# Patient Record
Sex: Female | Born: 1960 | Race: White | Hispanic: No | Marital: Married | Smoking: Never smoker
Health system: Southern US, Community
[De-identification: ages and names within clinical notes are randomized; demographics above are authoritative.]

## PROBLEM LIST (undated history)

## (undated) DIAGNOSIS — K219 Gastro-esophageal reflux disease without esophagitis: Secondary | ICD-10-CM

## (undated) DIAGNOSIS — R079 Chest pain, unspecified: Secondary | ICD-10-CM

## (undated) DIAGNOSIS — R002 Palpitations: Secondary | ICD-10-CM

## (undated) HISTORY — PX: OTHER SURGICAL HISTORY: SHX169

## (undated) HISTORY — PX: TONSILLECTOMY: SUR1361

## (undated) HISTORY — DX: Palpitations: R00.2

## (undated) HISTORY — DX: Chest pain, unspecified: R07.9

---

## 2011-12-15 ENCOUNTER — Other Ambulatory Visit (HOSPITAL_COMMUNITY): Payer: Self-pay | Admitting: Obstetrics & Gynecology

## 2011-12-15 DIAGNOSIS — Z1231 Encounter for screening mammogram for malignant neoplasm of breast: Secondary | ICD-10-CM

## 2012-02-26 ENCOUNTER — Ambulatory Visit (HOSPITAL_COMMUNITY)
Admission: RE | Admit: 2012-02-26 | Discharge: 2012-02-26 | Disposition: A | Discharge status: Home / Self Care | Payer: Managed Care, Other (non HMO) | Source: Ambulatory Visit | Attending: Obstetrics & Gynecology | Admitting: Obstetrics & Gynecology

## 2012-02-26 DIAGNOSIS — Z1231 Encounter for screening mammogram for malignant neoplasm of breast: Secondary | ICD-10-CM

## 2013-01-30 ENCOUNTER — Other Ambulatory Visit (HOSPITAL_COMMUNITY): Payer: Self-pay | Admitting: Obstetrics & Gynecology

## 2013-01-30 DIAGNOSIS — Z1231 Encounter for screening mammogram for malignant neoplasm of breast: Secondary | ICD-10-CM

## 2013-02-26 ENCOUNTER — Ambulatory Visit (HOSPITAL_COMMUNITY)
Admission: RE | Admit: 2013-02-26 | Discharge: 2013-02-26 | Disposition: A | Discharge status: Home / Self Care | Payer: Managed Care, Other (non HMO) | Source: Ambulatory Visit | Attending: Obstetrics & Gynecology | Admitting: Obstetrics & Gynecology

## 2013-02-26 DIAGNOSIS — Z1231 Encounter for screening mammogram for malignant neoplasm of breast: Secondary | ICD-10-CM | POA: Insufficient documentation

## 2014-01-28 ENCOUNTER — Other Ambulatory Visit (HOSPITAL_COMMUNITY): Payer: Self-pay | Admitting: Obstetrics & Gynecology

## 2014-01-28 DIAGNOSIS — Z1231 Encounter for screening mammogram for malignant neoplasm of breast: Secondary | ICD-10-CM

## 2014-02-27 ENCOUNTER — Ambulatory Visit (HOSPITAL_COMMUNITY)
Admission: RE | Admit: 2014-02-27 | Discharge: 2014-02-27 | Disposition: A | Discharge status: Home / Self Care | Payer: Managed Care, Other (non HMO) | Source: Ambulatory Visit | Attending: Obstetrics & Gynecology | Admitting: Obstetrics & Gynecology

## 2014-02-27 DIAGNOSIS — Z1231 Encounter for screening mammogram for malignant neoplasm of breast: Secondary | ICD-10-CM | POA: Insufficient documentation

## 2014-07-22 ENCOUNTER — Other Ambulatory Visit: Payer: Self-pay | Admitting: *Deleted

## 2014-07-22 DIAGNOSIS — I493 Ventricular premature depolarization: Secondary | ICD-10-CM

## 2014-07-24 ENCOUNTER — Encounter (INDEPENDENT_AMBULATORY_CARE_PROVIDER_SITE_OTHER): Payer: Managed Care, Other (non HMO)

## 2014-07-24 ENCOUNTER — Encounter: Payer: Self-pay | Admitting: Radiology

## 2014-07-24 DIAGNOSIS — I493 Ventricular premature depolarization: Secondary | ICD-10-CM

## 2014-07-24 DIAGNOSIS — I4949 Other premature depolarization: Secondary | ICD-10-CM

## 2014-07-24 NOTE — Progress Notes (Signed)
Patient ID: Mercedes Jarvis, female   DOB: 1961-06-29, 53 y.o.   MRN: [id number] E cardio 24hr holter applied

## 2014-07-28 ENCOUNTER — Emergency Department (HOSPITAL_COMMUNITY): Payer: Managed Care, Other (non HMO)

## 2014-07-28 ENCOUNTER — Emergency Department (HOSPITAL_COMMUNITY)
Admission: EM | Admit: 2014-07-28 | Discharge: 2014-07-28 | Disposition: A | Discharge status: Home / Self Care | Payer: Managed Care, Other (non HMO) | Attending: Emergency Medicine | Admitting: Emergency Medicine

## 2014-07-28 ENCOUNTER — Encounter (HOSPITAL_COMMUNITY): Payer: Self-pay | Admitting: Emergency Medicine

## 2014-07-28 DIAGNOSIS — R5381 Other malaise: Secondary | ICD-10-CM | POA: Diagnosis not present

## 2014-07-28 DIAGNOSIS — R42 Dizziness and giddiness: Secondary | ICD-10-CM | POA: Insufficient documentation

## 2014-07-28 DIAGNOSIS — Z79899 Other long term (current) drug therapy: Secondary | ICD-10-CM | POA: Insufficient documentation

## 2014-07-28 DIAGNOSIS — K219 Gastro-esophageal reflux disease without esophagitis: Secondary | ICD-10-CM | POA: Insufficient documentation

## 2014-07-28 DIAGNOSIS — R5383 Other fatigue: Secondary | ICD-10-CM

## 2014-07-28 DIAGNOSIS — R209 Unspecified disturbances of skin sensation: Secondary | ICD-10-CM | POA: Insufficient documentation

## 2014-07-28 DIAGNOSIS — R002 Palpitations: Secondary | ICD-10-CM | POA: Insufficient documentation

## 2014-07-28 DIAGNOSIS — M25519 Pain in unspecified shoulder: Secondary | ICD-10-CM | POA: Insufficient documentation

## 2014-07-28 HISTORY — DX: Gastro-esophageal reflux disease without esophagitis: K21.9

## 2014-07-28 LAB — CBC WITH DIFFERENTIAL/PLATELET
BASOS ABS: 0 10*3/uL (ref 0.0–0.1)
Basophils Relative: 0 % (ref 0–1)
EOS ABS: 0.1 10*3/uL (ref 0.0–0.7)
Eosinophils Relative: 2 % (ref 0–5)
HCT: 36.5 % (ref 36.0–46.0)
Hemoglobin: 12 g/dL (ref 12.0–15.0)
LYMPHS PCT: 51 % — AB (ref 12–46)
Lymphs Abs: 2 10*3/uL (ref 0.7–4.0)
MCH: 29 pg (ref 26.0–34.0)
MCHC: 32.9 g/dL (ref 30.0–36.0)
MCV: 88.2 fL (ref 78.0–100.0)
Monocytes Absolute: 0.3 10*3/uL (ref 0.1–1.0)
Monocytes Relative: 7 % (ref 3–12)
Neutro Abs: 1.5 10*3/uL — ABNORMAL LOW (ref 1.7–7.7)
Neutrophils Relative %: 40 % — ABNORMAL LOW (ref 43–77)
Platelets: 241 10*3/uL (ref 150–400)
RBC: 4.14 MIL/uL (ref 3.87–5.11)
RDW: 13.7 % (ref 11.5–15.5)
WBC: 3.8 10*3/uL — AB (ref 4.0–10.5)

## 2014-07-28 LAB — BASIC METABOLIC PANEL
Anion gap: 12 (ref 5–15)
BUN: 11 mg/dL (ref 6–23)
CALCIUM: 9.5 mg/dL (ref 8.4–10.5)
CO2: 27 meq/L (ref 19–32)
Chloride: 101 mEq/L (ref 96–112)
Creatinine, Ser: 0.75 mg/dL (ref 0.50–1.10)
Glucose, Bld: 99 mg/dL (ref 70–99)
Potassium: 3.8 mEq/L (ref 3.7–5.3)
Sodium: 140 mEq/L (ref 137–147)

## 2014-07-28 LAB — I-STAT TROPONIN, ED: TROPONIN I, POC: 0 ng/mL (ref 0.00–0.08)

## 2014-07-28 MED ORDER — SODIUM CHLORIDE 0.9 % IV BOLUS (SEPSIS)
1000.0000 mL | Freq: Once | INTRAVENOUS | Status: AC
  Administered 2014-07-28: 1000 mL via INTRAVENOUS

## 2014-07-28 NOTE — ED Provider Notes (Signed)
CSN: [id number]     Arrival date & time 07/28/14  0809 History   First MD Initiated Contact with Patient 07/28/14 0815     Chief Complaint  Patient presents with  . Shoulder Pain  . Numbness  . Palpitations   (Consider location/radiation/quality/duration/timing/severity/associated sxs/prior Treatment) Patient is a 53 y.o. female presenting with shoulder pain and palpitations. The history is provided by the patient.  Shoulder Pain This is a new problem. The current episode started yesterday. The problem occurs daily. The problem has been gradually worsening. Associated symptoms include arthralgias (left shoulder pain), numbness (left arm) and weakness (generalized). Pertinent negatives include no abdominal pain, chest pain, coughing, fever, headaches, nausea, neck pain, rash, sore throat, vertigo or vomiting.  Palpitations Palpitations quality:  Fast Onset quality:  Sudden Duration: >1 year. Timing:  Sporadic Progression:  Worsening Chronicity:  New Relieved by:  Nothing Exacerbated by: exertion. Ineffective treatments:  None tried Associated symptoms: numbness (left arm) and weakness (generalized)   Associated symptoms: no back pain, no chest pain, no cough, no dizziness, no nausea, no near-syncope, no shortness of breath, no syncope and no vomiting    The patient is a 53 year old Caucasian female with a history of GERD who presents with approximately one year palpitations. The patient states that she saw her PCP approximately one week ago for the symptoms. An EKG was done at that time and showed PVCs. The patient was placed on a 24-hour Holter monitor. During the 24 hour time frame the patient experienced multiple episodes of long-lasting palpitations as well as episodes of lightheadedness. The patient states that she went on her daily five-mile walk yesterday and experienced extreme weakness and fatigue afterwards. The patient wanted to come to the ED last night but hesitated and decided  to come this morning. The patient also reports a left arm and leg numbness which is worse in the upper extremity. She denies chest pain,  shortness of breath,  room spinning sensation, vaginal bleeding, rectal bleeding, hematuria, or history of anemia. The patient does not have any known cardiac disease. The patient's GERD is controlled with Protonix.  Past Medical History  Diagnosis Date  . GERD (gastroesophageal reflux disease)    History reviewed. No pertinent past surgical history. History reviewed. No pertinent family history. History  Substance Use Topics  . Smoking status: Never Smoker   . Smokeless tobacco: Not on file  . Alcohol Use: Yes   OB History   Grav Para Term Preterm Abortions TAB SAB Ect Mult Living                 Review of Systems  Constitutional: Negative for fever.  HENT: Negative for rhinorrhea and sore throat.   Eyes: Negative for visual disturbance.  Respiratory: Negative for cough, chest tightness and shortness of breath.   Cardiovascular: Positive for palpitations. Negative for chest pain, syncope and near-syncope.  Gastrointestinal: Negative for nausea, vomiting, abdominal pain, diarrhea, constipation, blood in stool and anal bleeding.  Genitourinary: Negative for dysuria, hematuria, vaginal bleeding and vaginal discharge.  Musculoskeletal: Positive for arthralgias (left shoulder pain). Negative for back pain and neck pain.  Skin: Negative for rash.  Neurological: Positive for weakness (generalized), light-headedness and numbness (left arm). Negative for dizziness, vertigo, syncope and headaches.  Psychiatric/Behavioral: Negative for confusion.  All other systems reviewed and are negative.  Allergies  Sulfa antibiotics  Home Medications   Prior to Admission medications   Medication Sig Start Date End Date Taking? Authorizing Provider  ibuprofen (ADVIL,MOTRIN)  200 MG tablet Take 600 mg by mouth daily as needed for headache.   Yes Historical Provider,  MD  MAGNESIUM PO Take 1 tablet by mouth daily as needed (Menstrual symptoms).   Yes Historical Provider, MD  Multiple Vitamins-Minerals (MULTIVITAMIN PO) Take by mouth.   Yes Historical Provider, MD  Omega-3 Fatty Acids (FISH OIL PO) Take 1 capsule by mouth daily.   Yes Historical Provider, MD  pantoprazole (PROTONIX) 40 MG tablet Take 40 mg by mouth daily. 05/16/14  Yes Historical Provider, MD   BP 141/76  Temp(Src) 98 F (36.7 C) (Oral)  Resp 12  SpO2 99%  LMP 07/19/2014 Physical Exam  Constitutional: She is oriented to person, place, and time. She appears well-developed and well-nourished. No distress.  HENT:  Head: Normocephalic and atraumatic.  Mouth/Throat: Oropharynx is clear and moist.  Eyes: EOM are normal. Pupils are equal, round, and reactive to light.  Neck: Neck supple. No JVD present. No thyromegaly present.  Cardiovascular: Normal rate, regular rhythm, normal heart sounds and intact distal pulses.  Exam reveals no gallop.   No murmur heard. Pulmonary/Chest: Effort normal and breath sounds normal. She has no wheezes. She has no rales.  Abdominal: Soft. She exhibits no distension. There is no tenderness.  Musculoskeletal: Normal range of motion. She exhibits no tenderness.  Neurological: She is alert and oriented to person, place, and time. She exhibits normal muscle tone.  Skin: Skin is warm and dry. No rash noted.   ED Course  Procedures  None   Labs Review Labs Reviewed  CBC WITH DIFFERENTIAL - Abnormal; Notable for the following:    WBC 3.8 (*)    Neutrophils Relative % 40 (*)    Neutro Abs 1.5 (*)    Lymphocytes Relative 51 (*)    All other components within normal limits  BASIC METABOLIC PANEL  I-STAT TROPOININ, ED    Imaging Review Dg Chest 2 View  07/28/2014   CLINICAL DATA:  Left chest pain extending down the left arm. Palpitations.  EXAM: CHEST  2 VIEW  COMPARISON:  None.  FINDINGS: Old granulomatous disease in the left chest with calcified hilar and  AP window lymph nodes and a 6 mm calcified granuloma in the left mid lung.  Minimal biapical pleural parenchymal scarring. Cardiac and mediastinal margins appear normal. No pleural effusion. Lungs appear otherwise clear. Mild thoracic spondylosis.  IMPRESSION: 1. No significant abnormality observed.   Electronically Signed   By: Herbie BaltimoreWalt  Liebkemann M.D.   On: 07/28/2014 09:10   MDM   Final diagnoses:  Palpitations   53 year old Caucasian female with history of GERD presents with palpitations and occasional lightheadedness. The patient is afebrile and occasionally tachycardic. Her EKG demonstrates normal sinus rhythm with a rate of 83 and no premature beats. There is no ST elevation or depression, QTC 408, no T-wave inversions. Do not suspect ACS. However given her left upper extremity symptoms will obtain troponin, CBC, BMP. We'll also provide 1 L normal saline bolus. I reviewed the labs and imaging and used them in my medical decision making. No anemia. No orthostasis. Trop negative. CXR unremarkable. Called to obtained Holter monitor report but unavailable at this time. Patient given NS bolus. Patient given F/U instructions to see cardiology. Strict return precautions given. Suspect patient symptoms due to paroxysmal PVCs. She voices understanding and is agreeable with this plan.  Stable at discharge.   Case discussed with Dr. Rubin PayorPickering.  Maris BergerJonah Velena Keegan, MD   Maris BergerJonah Cypress Hinkson, MD 07/28/14 1600

## 2014-07-28 NOTE — ED Notes (Signed)
Pt c/o "feeling heart flutter" -- occasional PVC's on monitor. Had a holter monitor on Friday and Saturday.

## 2014-07-28 NOTE — ED Notes (Signed)
Pt c/o left sided arm tingling and shoulder pain with palpitations x several days; pt sts was wearing heart monitor last week

## 2014-07-29 NOTE — ED Provider Notes (Signed)
I saw and evaluated the patient, reviewed the resident's note and I agree with the findings and plan.   EKG Interpretation   Date/Time:  Tuesday July 28 2014 08:14:07 EDT Ventricular Rate:  83 PR Interval:  130 QRS Duration: 88 QT Interval:  348 QTC Calculation: 408 R Axis:   67 Text Interpretation:  Normal sinus rhythm Indeterminate axis Borderline  ECG Confirmed by Rubin PayorPICKERING  MD, Harrold DonathNATHAN [phone]([id number]) on 07/29/2014 4:21:58 PM     Patient with palpitations and occasional lightheadedness. EKG reassuring. To follow up with cardiology as needed. May have  PVCs  Juliet RudeNathan R. Rubin PayorPickering, MD 07/29/14 1622

## 2014-08-04 ENCOUNTER — Encounter: Payer: Self-pay | Admitting: Cardiovascular Disease

## 2014-08-04 ENCOUNTER — Ambulatory Visit (INDEPENDENT_AMBULATORY_CARE_PROVIDER_SITE_OTHER): Payer: Managed Care, Other (non HMO) | Admitting: Cardiovascular Disease

## 2014-08-04 VITALS — BP 122/82 | HR 68 | Ht 66.0 in | Wt 143.0 lb

## 2014-08-04 DIAGNOSIS — R002 Palpitations: Secondary | ICD-10-CM | POA: Insufficient documentation

## 2014-08-04 DIAGNOSIS — R079 Chest pain, unspecified: Secondary | ICD-10-CM

## 2014-08-04 NOTE — Progress Notes (Signed)
08/04/2014 Mercedes Jarvis   1961/09/06  [id number]  Primary Physician Pcp Not In System Primary Cardiologist: Runell Gess MD [name]   HPI:  Mercedes Jarvis is a delightful 53 year old fit and thin appearing married Caucasian female mother of 2 children referred for evaluation of progressive palpitations and chest pain. She has no cardiac risk factors other than mild hyperlipidemia. She does have GERD. She's had palpitations for the last 12 months worse for the last several weeks. She does admit to drinking 3 cups of coffee a day. She stopped taking decongestants 6-8 months ago. She has been under some stress as well. She saw her primary care physician who ordered a Holter monitor. She was seen in the emergency room for excessive palpitations as well as left upper chest pain with left upper extremity radiation. She was noted to have PVCs. She ruled out for myocardial infarction. She is fairly active and walks 5 miles a day and recently has noted that she has been more fatigued after her workouts than usual.   Current Outpatient Prescriptions  Medication Sig Dispense Refill  . ibuprofen (ADVIL,MOTRIN) 200 MG tablet Take 600 mg by mouth daily as needed for headache.      Marland Kitchen MAGNESIUM PO Take 1 tablet by mouth daily as needed (Menstrual symptoms).      . Multiple Vitamins-Minerals (MULTIVITAMIN PO) Take by mouth.      . Omega-3 Fatty Acids (FISH OIL PO) Take 1 capsule by mouth daily.      . pantoprazole (PROTONIX) 40 MG tablet Take 40 mg by mouth daily.       No current facility-administered medications for this visit.    Allergies  Allergen Reactions  . Sulfa Antibiotics Hives    History   Social History  . Marital Status: Married    Spouse Name: N/A    Number of Children: N/A  . Years of Education: N/A   Occupational History  . Not on file.   Social History Main Topics  . Smoking status: Never Smoker   . Smokeless tobacco: Not on file  . Alcohol Use: Yes   . Drug Use: No  . Sexual Activity: Not on file   Other Topics Concern  . Not on file   Social History Narrative  . No narrative on file     Review of Systems: General: negative for chills, fever, night sweats or weight changes.  Cardiovascular: negative for chest pain, dyspnea on exertion, edema, orthopnea, palpitations, paroxysmal nocturnal dyspnea or shortness of breath Dermatological: negative for rash Respiratory: negative for cough or wheezing Urologic: negative for hematuria Abdominal: negative for nausea, vomiting, diarrhea, bright red blood per rectum, melena, or hematemesis Neurologic: negative for visual changes, syncope, or dizziness All other systems reviewed and are otherwise negative except as noted above.    Blood pressure 122/82, pulse 68, height 5\' 6"  (1.676 m), weight 143 lb (64.864 kg), last menstrual period 07/19/2014.  General appearance: alert and no distress Neck: no adenopathy, no carotid bruit, no JVD, supple, symmetrical, trachea midline and thyroid not enlarged, symmetric, no tenderness/mass/nodules Lungs: clear to auscultation bilaterally Heart: regular rate and rhythm, S1, S2 normal, no murmur, click, rub or gallop Extremities: extremities normal, atraumatic, no cyanosis or edema and 2+ pedal pulses  EKG not performed today  ASSESSMENT AND PLAN:   Palpitations History of nocturnal as well as daytime palpitations over the last 12 months more noticeable over the last several months and weeks. She has had some personal  stress. She saw her primary care physician who ordered a monitor. She was in the emergency room with tachypalpitations, chest and arm pain and ruled out for myocardial infarction. A Holter monitor showed PVCs. She does drink a cup of coffee a day and as back on that. She's also stop Sudafed-type drugs in the last several months. Unconcerned about her progressive PVCs along with chest pain with left upper extremity radiation. I'm going to  get a exercise Myoview stress test and a 2D echocardiogram.      Runell GessJonathan J. Joselinne Lawal MD Mercedes Jarvis, LLCFACP,FACC,FAHA, [facility] LPFSCAI 08/04/2014 12:30 PM

## 2014-08-04 NOTE — Assessment & Plan Note (Signed)
History of nocturnal as well as daytime palpitations over the last 12 months more noticeable over the last several months and weeks. She has had some personal stress. She saw her primary care physician who ordered a monitor. She was in the emergency room with tachypalpitations, chest and arm pain and ruled out for myocardial infarction. A Holter monitor showed PVCs. She does drink a cup of coffee a day and as back on that. She's also stop Sudafed-type drugs in the last several months. Unconcerned about her progressive PVCs along with chest pain with left upper extremity radiation. I'm going to get a exercise Myoview stress test and a 2D echocardiogram.

## 2014-08-04 NOTE — Patient Instructions (Signed)
  We will see you back in follow up after the test.   Dr Allyson SabalBerry has ordered: 1. Exercise Myoview- this is a test that looks at the blood flow to your heart muscle.  It takes approximately 2 1/2 hours. Please follow instruction sheet, as given.   2.  Echocardiogram. Echocardiography is a painless test that uses sound waves to create images of your heart. It provides your doctor with information about the size and shape of your heart and how well your heart's chambers and valves are working. This procedure takes approximately one hour. There are no restrictions for this procedure.

## 2014-08-05 ENCOUNTER — Telehealth (HOSPITAL_COMMUNITY): Payer: Self-pay

## 2014-08-05 ENCOUNTER — Ambulatory Visit (HOSPITAL_COMMUNITY)
Admission: RE | Admit: 2014-08-05 | Discharge: 2014-08-05 | Disposition: A | Discharge status: Home / Self Care | Payer: Managed Care, Other (non HMO) | Source: Ambulatory Visit | Attending: Cardiovascular Disease | Admitting: Cardiovascular Disease

## 2014-08-05 DIAGNOSIS — R079 Chest pain, unspecified: Secondary | ICD-10-CM

## 2014-08-05 DIAGNOSIS — R002 Palpitations: Secondary | ICD-10-CM | POA: Diagnosis not present

## 2014-08-05 DIAGNOSIS — I4949 Other premature depolarization: Secondary | ICD-10-CM

## 2014-08-05 NOTE — Telephone Encounter (Signed)
Encounter complete. 

## 2014-08-05 NOTE — Progress Notes (Signed)
2D Echocardiogram Complete.  08/05/2014   Xitlali Kastens, RDCS  

## 2014-08-06 ENCOUNTER — Telehealth: Payer: Self-pay | Admitting: Cardiovascular Disease

## 2014-08-06 NOTE — Telephone Encounter (Signed)
Patient scheduled for nuclear stress test.  Difficulty obtaining authorization from [employer].  Abbe AmsterdamKathryn Vogel RN talked to patient re this.  She still wanted to pursue testing regardless of approval.  I called patient to provide approximate cost of $4500 to $5000.  She indicated she would still have test done.  Wilburt FinlayBryan Hager PA-C is going to try to obtain approval via physician review w/[employer].

## 2014-08-07 ENCOUNTER — Ambulatory Visit (HOSPITAL_COMMUNITY)
Admission: RE | Admit: 2014-08-07 | Discharge: 2014-08-07 | Disposition: A | Discharge status: Home / Self Care | Payer: Managed Care, Other (non HMO) | Source: Ambulatory Visit | Attending: Cardiology | Admitting: Cardiology

## 2014-08-07 ENCOUNTER — Telehealth: Payer: Self-pay | Admitting: Cardiovascular Disease

## 2014-08-07 DIAGNOSIS — E785 Hyperlipidemia, unspecified: Secondary | ICD-10-CM | POA: Insufficient documentation

## 2014-08-07 DIAGNOSIS — R079 Chest pain, unspecified: Secondary | ICD-10-CM | POA: Diagnosis not present

## 2014-08-07 DIAGNOSIS — R002 Palpitations: Secondary | ICD-10-CM

## 2014-08-07 DIAGNOSIS — R42 Dizziness and giddiness: Secondary | ICD-10-CM | POA: Insufficient documentation

## 2014-08-07 DIAGNOSIS — Z87891 Personal history of nicotine dependence: Secondary | ICD-10-CM | POA: Insufficient documentation

## 2014-08-07 DIAGNOSIS — R5383 Other fatigue: Secondary | ICD-10-CM | POA: Insufficient documentation

## 2014-08-07 MED ORDER — TECHNETIUM TC 99M SESTAMIBI GENERIC - CARDIOLITE
10.5000 | Freq: Once | INTRAVENOUS | Status: AC | PRN
  Administered 2014-08-07: 11 via INTRAVENOUS

## 2014-08-07 MED ORDER — TECHNETIUM TC 99M SESTAMIBI GENERIC - CARDIOLITE
30.2000 | Freq: Once | INTRAVENOUS | Status: AC | PRN
  Administered 2014-08-07: 30.2 via INTRAVENOUS

## 2014-08-07 NOTE — Procedures (Addendum)
[facility] [address] [place] [address] [phone]  Cardiology Nuclear Med Study  Mercedes Jarvis is a 53 y.o. female     MRN : [id number]     DOB: 1961-06-30  Procedure Date: 08/07/2014  Nuclear Med Background Indication for Stress Test:  Evaluation for Ischemia and Post Hospital History:  No prior cardiac or respiratory history reported;No prior NUC MPI for comparison. Cardiac Risk Factors: History of Smoking and Lipids  Symptoms:  Chest Pain, Dizziness, Fatigue, Light-Headedness, Near Syncope, Palpitations and Left arm numbness.   Nuclear Pre-Procedure Caffeine/Decaff Intake:  7:00pm NPO After: 5:00am   IV Site: R Forearm  IV 0.9% NS with Angio Cath:  22g  Chest Size (in):  n/a IV Started by: Berdie OgrenAmanda Wease, RN  Height: 5\' 6"  (1.676 m)  Cup Size: A  BMI:  Body mass index is 23.09 kg/(m^2). Weight:  143 lb (64.864 kg)   Tech Comments:  n/a    Nuclear Med Study 1 or 2 day study: 1 day  Stress Test Type:  Stress  Order Authorizing Provider:  Nanetta BattyJonathan Berry, MD   Resting Radionuclide: Technetium 7084m Sestamibi  Resting Radionuclide Dose: 10.5 mCi   Stress Radionuclide:  Technetium 5584m Sestamibi  Stress Radionuclide Dose: 30.2 mCi           Stress Protocol Rest HR: 81 Stress HR:  166  Rest BP: 126/100 Stress BP: 163/120  Exercise Time (min): 9 METS: 10.1   Predicted Max HR: 169 bpm % Max HR: 98.22 bpm Rate Pressure Product: [id number]  Dose of Adenosine (mg):  n/a Dose of Lexiscan: n/a mg  Dose of Atropine (mg): n/a Dose of Dobutamine: n/a mcg/kg/min (at max HR)  Stress Test Technologist: Esperanza Sheetserry-Marie Martin, CCT Nuclear Technologist: Gonzella LexPam Phillips, CNMT   Rest Procedure:  Myocardial perfusion imaging was performed at rest 45 minutes following the intravenous administration of Technetium 6184m Sestamibi. Stress Procedure:  The patient performed treadmill exercise using a Bruce  Protocol for 9 minutes. The patient  stopped due to General Fatigue and denied any chest pain.  There were no significant ST-T wave changes.  Technetium 4284m Sestamibi was injected at peak exercise and myocardial perfusion imaging was performed after a brief delay.  Transient Ischemic Dilatation (Normal <1.22):  0.99  QGS EDV:  98 ml QGS ESV:  40 ml LV Ejection Fraction: 59%       Rest ECG: NSR - Normal EKG  Stress ECG: No significant change from baseline ECG  QPS Raw Data Images:  Normal; no motion artifact; normal heart/lung ratio. Stress Images:  Normal homogeneous uptake in all areas of the myocardium. Rest Images:  Normal homogeneous uptake in all areas of the myocardium. Subtraction (SDS):  No evidence of ischemia.  Impression Exercise Capacity:  Good exercise capacity. BP Response:  Normal blood pressure response. Clinical Symptoms:  No significant symptoms noted. ECG Impression:  No significant ST segment change suggestive of ischemia. Comparison with Prior Nuclear Study: No images to compare  Overall Impression:  Normal stress nuclear study.  LV Wall Motion:  NL LV Function; NL Wall Motion   Runell GessBERRY,JONATHAN J, MD  08/07/2014 11:23 AM

## 2014-08-07 NOTE — Telephone Encounter (Signed)
The doctor called in regards to a prior authorization for a stress test for the pt.Please call back  Thanks

## 2014-08-07 NOTE — Telephone Encounter (Signed)
I spoke with Dr Fredrich BirksAbramowitz and he reviewed Dr Hazle CocaBerry's office note with me and said that he would approve the nuclear stress test.  He said a PA number would be faxed to me.

## 2014-08-18 ENCOUNTER — Encounter: Payer: Self-pay | Admitting: Cardiovascular Disease

## 2014-08-18 ENCOUNTER — Ambulatory Visit (INDEPENDENT_AMBULATORY_CARE_PROVIDER_SITE_OTHER): Payer: Managed Care, Other (non HMO) | Admitting: Cardiovascular Disease

## 2014-08-18 VITALS — BP 119/73 | HR 81 | Ht 66.0 in | Wt 142.0 lb

## 2014-08-18 DIAGNOSIS — R079 Chest pain, unspecified: Secondary | ICD-10-CM

## 2014-08-18 DIAGNOSIS — R002 Palpitations: Secondary | ICD-10-CM

## 2014-08-18 LAB — T4, FREE: Free T4: 1.13 ng/dL (ref 0.80–1.80)

## 2014-08-18 LAB — VITAMIN B12: Vitamin B-12: 417 pg/mL (ref 211–911)

## 2014-08-18 LAB — TSH: TSH: 1.588 u[IU]/mL (ref 0.350–4.500)

## 2014-08-18 MED ORDER — METOPROLOL SUCCINATE ER 25 MG PO TB24: 12.5000 mg | ORAL_TABLET | Freq: Every day | ORAL | Status: DC

## 2014-08-18 NOTE — Assessment & Plan Note (Signed)
She had a Myoview stress test which was entirely normal. Her pain has subsided. I doubt whether this was ischemic. She does complain of some tingling in her left upper extremity which may be related to cervical radiculopathy.

## 2014-08-18 NOTE — Patient Instructions (Signed)
Dr Allyson SabalBerry has recommended you have blood work done at your earliest convenience.  Your physician has recommended making the following medication changes:  START Toprol XL (Metoprolol Succinate) 12.5 mg - you will take half a tablet daily  Dr Allyson SabalBerry recommends that you schedule a follow-up appointment in 3 months.

## 2014-08-18 NOTE — Assessment & Plan Note (Signed)
Still experiencing palpitations which are in all likelihood PVCs documented on Holter monitoring. She has decreased her caffeine intake as well as alcohol as this continued decongestant use. I'm going to check thyroid function tests and begin her on a low-dose beta blocker. Her 2-D echo is entirely normal.

## 2014-08-18 NOTE — Progress Notes (Signed)
Ms. Mercedes Jarvis returns today for followup. A 2-D echo and Myoview stress tests were normal.her Holter monitor did show frequent PVCs. She has cut down her caffeine and alcohol intake. I'm going to check some routine labs including thyroid function tests. And empirically begin her on a low-dose beta blocker I will see her back in 3 months.  Runell GessJonathan J. Alexiana Laverdure, M.D., FACP, [facility]ACC, Earl LagosFAHA, [facility] [address] [place], [place]  [address]  [phone] 08/18/2014 11:58 AM

## 2014-10-05 ENCOUNTER — Other Ambulatory Visit: Payer: Self-pay | Admitting: *Deleted

## 2014-10-05 MED ORDER — METOPROLOL SUCCINATE ER 25 MG PO TB24: 12.5000 mg | ORAL_TABLET | Freq: Every day | ORAL | Status: DC

## 2014-10-05 NOTE — Telephone Encounter (Signed)
Refills adjusted to 90 day supply as requested

## 2014-12-04 ENCOUNTER — Encounter: Payer: Self-pay | Admitting: Cardiovascular Disease

## 2014-12-04 ENCOUNTER — Ambulatory Visit (INDEPENDENT_AMBULATORY_CARE_PROVIDER_SITE_OTHER): Payer: Managed Care, Other (non HMO) | Admitting: Cardiovascular Disease

## 2014-12-04 VITALS — BP 126/60 | HR 68 | Ht 66.0 in | Wt 143.1 lb

## 2014-12-04 DIAGNOSIS — R002 Palpitations: Secondary | ICD-10-CM

## 2014-12-04 NOTE — Progress Notes (Signed)
12/04/2014 Verne Grainana Thoen   02-05-1961  [id number]  Primary Physician Orland PenmanJARALLA SHAMLEFFER, IBTEHAL, MD Primary Cardiologist: Runell GessJonathan J. Jashawn Floyd MD Roseanne RenoFACP,FACC,FAHA, FSCAI   HPI:  Ms. Vernice JeffersonCaldarera is a delightful 54 year old fit and thin appearing married Caucasian female mother of 2 children referred for evaluation of progressive palpitations and chest pain.  I last saw her in the office 08/18/14. She has no cardiac risk factors other than mild hyperlipidemia. She does have GERD. She's had palpitations for the last 12 months worse for the last several weeks. She does admit to drinking 3 cups of coffee a day. She stopped taking decongestants 6-8 months ago. She has been under some stress as well. She saw her primary care physician who ordered a Holter monitor. She was seen in the emergency room for excessive palpitations as well as left upper chest pain with left upper extremity radiation. She was noted to have PVCs. She ruled out for myocardial infarction. She is fairly active and walks 5 miles a day and recently has noted that she has been more fatigued after her workouts than usual. A Myoview stress test performed 08/07/14 was normal as was a 2-D echo. Since being on a beta blocker she feels somewhat washed out and lethargic.   Current Outpatient Prescriptions  Medication Sig Dispense Refill  . ibuprofen (ADVIL,MOTRIN) 200 MG tablet Take 600 mg by mouth daily as needed for headache.    Marland Kitchen. MAGNESIUM PO Take 1 tablet by mouth daily as needed (Menstrual symptoms).    . metoprolol succinate (TOPROL XL) 25 MG 24 hr tablet Take 0.5 tablets (12.5 mg total) by mouth daily. 135 tablet 2  . Multiple Vitamins-Minerals (MULTIVITAMIN PO) Take by mouth.    . Omega-3 Fatty Acids (FISH OIL PO) Take 1 capsule by mouth daily.    . pantoprazole (PROTONIX) 40 MG tablet Take 40 mg by mouth daily.     No current facility-administered medications for this visit.    Allergies  Allergen Reactions  . Sulfa Antibiotics  Hives    History   Social History  . Marital Status: Married    Spouse Name: N/A    Number of Children: N/A  . Years of Education: N/A   Occupational History  . Not on file.   Social History Main Topics  . Smoking status: Never Smoker   . Smokeless tobacco: Not on file  . Alcohol Use: Yes  . Drug Use: No  . Sexual Activity: Not on file   Other Topics Concern  . Not on file   Social History Narrative     Review of Systems: General: negative for chills, fever, night sweats or weight changes.  Cardiovascular: negative for chest pain, dyspnea on exertion, edema, orthopnea, palpitations, paroxysmal nocturnal dyspnea or shortness of breath Dermatological: negative for rash Respiratory: negative for cough or wheezing Urologic: negative for hematuria Abdominal: negative for nausea, vomiting, diarrhea, bright red blood per rectum, melena, or hematemesis Neurologic: negative for visual changes, syncope, or dizziness All other systems reviewed and are otherwise negative except as noted above.    Blood pressure 126/60, pulse 68, height 5\' 6"  (1.676 m), weight 143 lb 1.6 oz (64.91 kg).  General appearance: alert and no distress Neck: no adenopathy, no carotid bruit, no JVD, supple, symmetrical, trachea midline and thyroid not enlarged, symmetric, no tenderness/mass/nodules Lungs: clear to auscultation bilaterally Heart: regular rate and rhythm, S1, S2 normal, no murmur, click, rub or gallop Extremities: extremities normal, atraumatic, no cyanosis or edema  EKG not  performed today  ASSESSMENT AND PLAN:   Palpitations Palpitations demonstrated on Holter monitoring to be PVCs improved with low-dose beta blocker however she is symptomatic from Toprol-XL 12.5 mg a day. She had a negative Myoview normal 2-D echo. She did cut her caffeine back significantly. She was under a lot of stress at that time which is less so now. I told her to wean herself off her beta blocker over the next 2  weeks. She feels C mid-level back in 3 months me back in 6 months. Should her palpitations recur she is aware to go back on her beta blocker.       Runell Gess MD FACP,FACC,FAHA, [facility] 12/04/2014 12:25 PM

## 2014-12-04 NOTE — Patient Instructions (Signed)
We request that you follow-up in: 3 months with an extender and in 1 year with Dr Allyson SabalBerry.  You will receive a reminder letter in the mail two months in advance. If you don't receive a letter, please call our office to schedule the follow-up appointment.

## 2014-12-04 NOTE — Assessment & Plan Note (Signed)
Palpitations demonstrated on Holter monitoring to be PVCs improved with low-dose beta blocker however she is symptomatic from Toprol-XL 12.5 mg a day. She had a negative Myoview normal 2-D echo. She did cut her caffeine back significantly. She was under a lot of stress at that time which is less so now. I told her to wean herself off her beta blocker over the next 2 weeks. She feels C mid-level back in 3 months me back in 6 months. Should her palpitations recur she is aware to go back on her beta blocker.

## 2015-03-08 ENCOUNTER — Ambulatory Visit (INDEPENDENT_AMBULATORY_CARE_PROVIDER_SITE_OTHER): Payer: Managed Care, Other (non HMO) | Admitting: Cardiology

## 2015-03-08 ENCOUNTER — Encounter: Payer: Self-pay | Admitting: Cardiology

## 2015-03-08 VITALS — BP 110/72 | HR 82 | Ht 66.0 in | Wt 141.3 lb

## 2015-03-08 DIAGNOSIS — R002 Palpitations: Secondary | ICD-10-CM | POA: Diagnosis not present

## 2015-03-08 MED ORDER — CARVEDILOL 3.125 MG PO TABS: 3.1250 mg | ORAL_TABLET | Freq: Two times a day (BID) | ORAL | Status: DC

## 2015-03-08 NOTE — Progress Notes (Signed)
Cardiology Office Note   Date:  03/08/2015   ID:  Mercedes Jarvis, DOB Dec 27, 1960, MRN [id number]  PCP:  Tommy RainwaterShamleffer, Ibethal JARALLA, MD  Cardiologist:  Dr. Allyson SabalBerry    Chief Complaint  Patient presents with  . Palpitations    METOPROLOL STOPPED/SLIGHT INCREASE IN PALPITATIONS      History of Present Illness: Mercedes Jarvis is a 54 y.o. female who presents for palpitations.     She has no cardiac risk factors other than mild hyperlipidemia. She does have GERD. She's had palpitations for the months worse for the last several weeks before previously evaluated by Dr. Erlene QuanJ. Berry.  She had been drinking 3 cups of coffee a day now down to 2, and no tea with caffeine.. She stopped taking decongestants. She has been under some stress as well. She saw her primary care physician who ordered a Holter monitor. She was seen in the emergency room for excessive palpitations as well as left upper chest pain with left upper extremity radiation. She was noted to have PVCs. She ruled out for myocardial infarction. She is fairly active and walks 5 miles a day and recently has noted  A Myoview stress test performed 08/07/14 was normal as was a 2-D echo. On a beta blocker she felt somewhat washed out and lethargic. So BB stopped and now with increased stress she has an increase in palpitations. No chest pain.  No SOB, her son is accepted into medical school .   Past Medical History  Diagnosis Date  . GERD (gastroesophageal reflux disease)   . Palpitations     PVCs on Holter monitor  . Chest pain     No past surgical history on file.   Current Outpatient Prescriptions  Medication Sig Dispense Refill  . ibuprofen (ADVIL,MOTRIN) 200 MG tablet Take 600 mg by mouth daily as needed for headache.    Marland Kitchen. MAGNESIUM PO Take 1 tablet by mouth daily as needed (Menstrual symptoms).    . Multiple Vitamins-Minerals (MULTIVITAMIN PO) Take by mouth.    . Omega-3 Fatty Acids (FISH OIL PO) Take 1 capsule by mouth daily.    .  pantoprazole (PROTONIX) 40 MG tablet Take 40 mg by mouth daily.    . carvedilol (COREG) 3.125 MG tablet Take 1 tablet (3.125 mg total) by mouth 2 (two) times daily. 60 tablet 12  . PAZEO 0.7 % SOLN Place 1 drop into both eyes as needed.  3   No current facility-administered medications for this visit.    Allergies:   Sulfa antibiotics    Social History:  The patient  reports that she has never smoked. She does not have any smokeless tobacco history on file. She reports that she drinks alcohol. She reports that she does not use illicit drugs.   Family History:  The patient's family history includes Hypertension in her mother.    ROS:  General:no colds or fevers, no weight changes Skin:no rashes or ulcers HEENT:no blurred vision, no congestion CV:see HPI PUL:see HPI GI:no diarrhea constipation or melena, no indigestion GU:no hematuria, no dysuria MS:no joint pain, no claudication Neuro:no syncope, no lightheadedness Endo:no diabetes, no thyroid disease  Wt Readings from Last 3 Encounters:  03/08/15 141 lb 4.8 oz (64.093 kg)  12/04/14 143 lb 1.6 oz (64.91 kg)  08/18/14 142 lb (64.411 kg)     PHYSICAL EXAM: VS:  BP 110/72 mmHg  Pulse 82  Ht 5\' 6"  (1.676 m)  Wt 141 lb 4.8 oz (64.093 kg)  BMI 22.82 kg/m2  LMP 02/17/2015 (Approximate) , BMI Body mass index is 22.82 kg/(m^2). General:Pleasant affect, NAD Skin:Warm and dry, brisk capillary refill HEENT:normocephalic, sclera clear, mucus membranes moist Neck:supple, no JVD, no bruits  Heart:S1S2 RRR without murmur, gallup, rub or click Lungs:clear without rales, rhonchi, or wheezes ZOX:WRUEAbd:soft, non tender, + BS, do not palpate liver spleen or masses Ext:no lower ext edema, 2+ pedal pulses, 2+ radial pulses Neuro:alert and oriented, MAE, follows commands, + facial symmetry    EKG:  EKG is ordered today. The ekg ordered today demonstrates SR no changes from 07/29/15   Recent Labs: 07/28/2014: BUN 11; Creatinine 0.75; Hemoglobin  12.0; Platelets 241; Potassium 3.8; Sodium 140 08/18/2014: TSH 1.588    Lipid Panel No results found for: CHOL, TRIG, HDL, CHOLHDL, VLDL, LDLCALC, LDLDIRECT     Other studies Reviewed: Additional studies/ records that were reviewed today include: previous notes nad EKGs.   ASSESSMENT AND PLAN:  Palpitations Palpitations demonstrated on Holter monitoring to be PVCs improved with low-dose beta blocker however she is symptomatic from Toprol-XL 12.5 mg a day. She had a negative Myoview normal 2-D echo. She did cut her caffeine back significantly. She was under a lot of stress at that time which is less so now. She weaned off toprol.  Now with increased palpitations, will add coreg 3.125 mg BID, if the PVCs become increased, she can take and if no fatigue she will continue to take.  If severe fatigue she will wean herself back off once she has completed this stressful time.  She will follow up with Dr. Erlene QuanJ. Berry in 3 months.     Current medicines are reviewed with the patient today.  The patient Has no concerns regarding medicines.  The following changes have been made:  See above Labs/ tests ordered today include:see above  Disposition:   FU:  see above  Signed, Leone BrandINGOLD,LAURA R, NP  03/08/2015 6:11 PM    [facility] [address], [place], [place]  [address]/ [address] [place], [place] Phone: [phone]; Fax: [phone]  [phone]

## 2015-03-08 NOTE — Patient Instructions (Signed)
Your physician recommends that you schedule a follow-up appointment in: 3 MONTHS WITH DR BERRY  START CARVEDILOL 3.125 MG TWICE DAILY AS NEEDED

## 2015-06-04 ENCOUNTER — Ambulatory Visit: Payer: Managed Care, Other (non HMO) | Admitting: Cardiovascular Disease

## 2015-10-05 ENCOUNTER — Ambulatory Visit: Payer: Managed Care, Other (non HMO) | Admitting: Cardiovascular Disease

## 2015-10-12 ENCOUNTER — Ambulatory Visit: Payer: Managed Care, Other (non HMO) | Admitting: Cardiovascular Disease

## 2015-10-19 ENCOUNTER — Encounter: Payer: Self-pay | Admitting: Cardiovascular Disease

## 2015-10-19 ENCOUNTER — Ambulatory Visit (INDEPENDENT_AMBULATORY_CARE_PROVIDER_SITE_OTHER): Payer: Managed Care, Other (non HMO) | Admitting: Cardiovascular Disease

## 2015-10-19 VITALS — BP 102/78 | HR 64 | Ht 66.0 in | Wt 142.0 lb

## 2015-10-19 DIAGNOSIS — R002 Palpitations: Secondary | ICD-10-CM

## 2015-10-19 NOTE — Progress Notes (Signed)
10/19/2015 Mercedes Jarvis   1961/03/09  [id number]  Primary Physician Shamleffer, Landry Mellow, MD Primary Cardiologist: Runell Gess MD [name]   HPI:  Mercedes Jarvis is a delightful 54 year old fit and thin appearing married Caucasian female mother of 2 children referred for evaluation of progressive palpitations and chest pain. I last saw her in the office 12/04/14. She has no cardiac risk factors other than mild hyperlipidemia. She does have GERD. She's had palpitations for the last 12 months worse for the last several weeks. She does admit to drinking 3 cups of coffee a day. She stopped taking decongestants 6-8 months ago. She has been under some stress as well. She saw her primary care physician who ordered a Holter monitor. She was seen in the emergency room for excessive palpitations as well as left upper chest pain with left upper extremity radiation. She was noted to have PVCs. She ruled out for myocardial infarction. She is fairly active and walks 5 miles a day and recently has noted that she has been more fatigued after her workouts than usual. A Myoview stress test performed 08/07/14 was normal as was a 2-D echo. We tried low-dose metoprolol briefly which she did not tolerate. Since she was seen back in May of this year her palpitations have clinically improved. Her son got into [employer] medical school in [place].   Current Outpatient Prescriptions  Medication Sig Dispense Refill  . ibuprofen (ADVIL,MOTRIN) 200 MG tablet Take 600 mg by mouth daily as needed for headache.    . levocetirizine (XYZAL) 5 MG tablet Take 5 mg by mouth as needed for allergies.    Marland Kitchen MAGNESIUM PO Take 1 tablet by mouth daily as needed (Menstrual symptoms).    . Multiple Vitamins-Minerals (MULTIVITAMIN PO) Take by mouth.    . Omega-3 Fatty Acids (FISH OIL PO) Take 1 capsule by mouth daily.    . pantoprazole (PROTONIX) 40 MG tablet Take 40 mg by mouth daily.    Marland Kitchen PAZEO 0.7 % SOLN Place 1  drop into both eyes as needed.  3   No current facility-administered medications for this visit.    Allergies  Allergen Reactions  . Sulfa Antibiotics Hives    Social History   Social History  . Marital Status: Married    Spouse Name: N/A  . Number of Children: N/A  . Years of Education: N/A   Occupational History  . Not on file.   Social History Main Topics  . Smoking status: Never Smoker   . Smokeless tobacco: Not on file  . Alcohol Use: 0.0 oz/week    0 Standard drinks or equivalent per week  . Drug Use: No  . Sexual Activity: Not on file   Other Topics Concern  . Not on file   Social History Narrative     Review of Systems: General: negative for chills, fever, night sweats or weight changes.  Cardiovascular: negative for chest pain, dyspnea on exertion, edema, orthopnea, palpitations, paroxysmal nocturnal dyspnea or shortness of breath Dermatological: negative for rash Respiratory: negative for cough or wheezing Urologic: negative for hematuria Abdominal: negative for nausea, vomiting, diarrhea, bright red blood per rectum, melena, or hematemesis Neurologic: negative for visual changes, syncope, or dizziness All other systems reviewed and are otherwise negative except as noted above.    Blood pressure 102/78, pulse 64, height  (1.676 m), weight 142 lb (64.411 kg).  General appearance: alert and no distress Neck: no adenopathy, no carotid bruit, no JVD, supple,  symmetrical, trachea midline and thyroid not enlarged, symmetric, no tenderness/mass/nodules Lungs: clear to auscultation bilaterally Heart: regular rate and rhythm, S1, S2 normal, no murmur, click, rub or gallop Extremities: extremities normal, atraumatic, no cyanosis or edema  EKG normal sinus rhythm at 64 without ST or T-wave changes. I personally reviewed this EKG  ASSESSMENT AND PLAN:   Palpitations Mercedes Jarvis returns for follow-up of palpitations. These have significantly improved  since she was last seen back in May. She was taking a beta blocker briefly but was intolerant to this. We assumed that her palpitations were related to stress, caffeine and perimenopause. Her workup was entirely benign. I will see her back as needed.      Runell GessJonathan J. Cuyler Vandyken MD FACP,FACC,FAHA, [facility] 10/19/2015 10:01 AM

## 2015-10-19 NOTE — Patient Instructions (Signed)
Your physician recommends that you schedule a follow-up appointment in: AS NEEDED  

## 2015-10-19 NOTE — Assessment & Plan Note (Signed)
Ms Mercedes Jarvis returns for follow-up of palpitations. These have significantly improved since she was last seen back in May. She was taking a beta blocker briefly but was intolerant to this. We assumed that her palpitations were related to stress, caffeine and perimenopause. Her workup was entirely benign. I will see her back as needed.

## 2015-12-21 IMAGING — CR DG CHEST 2V
2 series · 2 of 2 positions shown · non-contrast
Comparison: None.

CLINICAL DATA: Left chest pain extending down the left arm.
Palpitations.

EXAM:
CHEST  2 VIEW

[w chest pa]
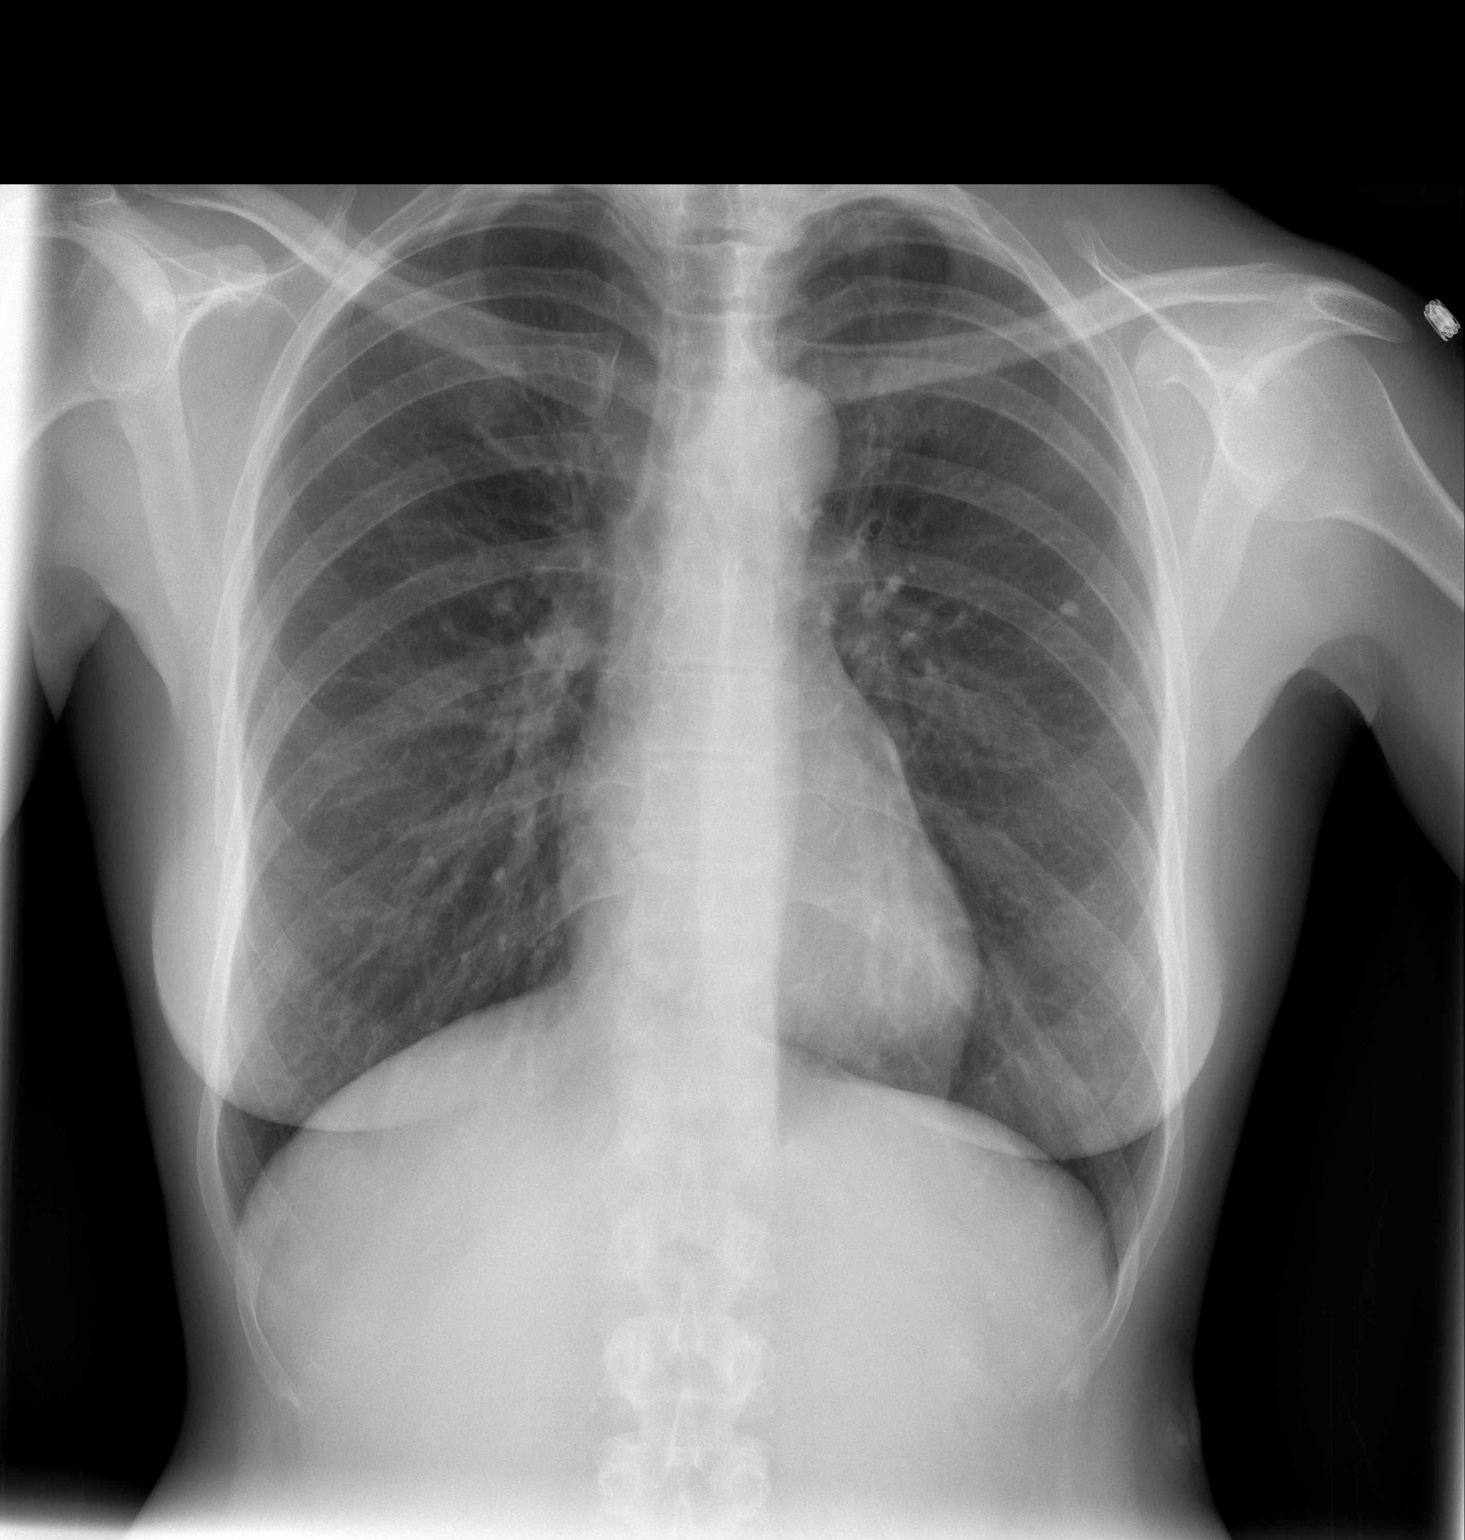

[w chest lat]
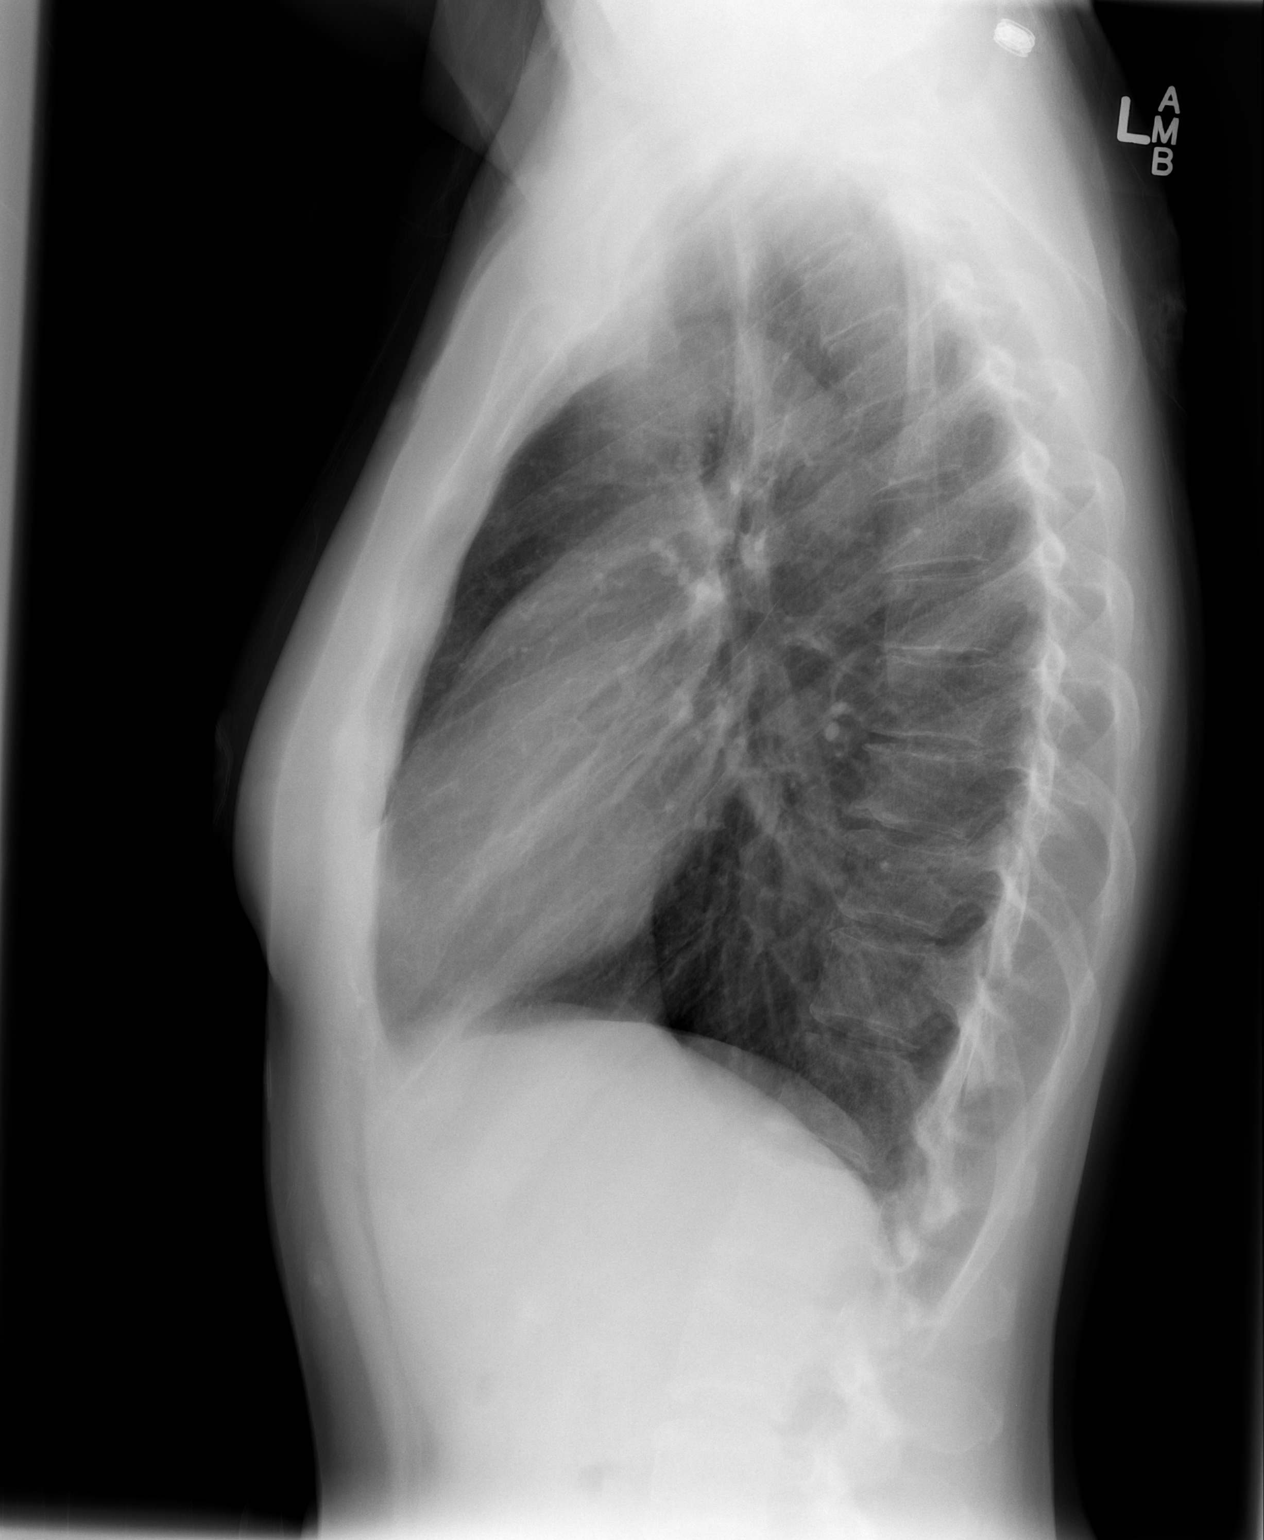

[2 of 2 positions shown; findings below may reference images not displayed]

FINDINGS: Old granulomatous disease in the left chest with calcified hilar and
AP window lymph nodes and a 6 mm calcified granuloma in the left mid
lung.

Minimal biapical pleural parenchymal scarring. Cardiac and
mediastinal margins appear normal. No pleural effusion. Lungs appear
otherwise clear. Mild thoracic spondylosis.
IMPRESSION: 1. No significant abnormality observed.

## 2016-03-08 ENCOUNTER — Encounter: Payer: Self-pay | Admitting: Obstetrics & Gynecology

## 2017-03-09 ENCOUNTER — Encounter: Payer: Self-pay | Admitting: Obstetrics & Gynecology

## 2017-03-14 ENCOUNTER — Encounter: Payer: Self-pay | Admitting: Obstetrics & Gynecology

## 2017-03-14 ENCOUNTER — Ambulatory Visit (INDEPENDENT_AMBULATORY_CARE_PROVIDER_SITE_OTHER): Payer: Managed Care, Other (non HMO) | Admitting: Obstetrics & Gynecology

## 2017-03-14 VITALS — BP 130/72 | Ht 66.0 in | Wt 133.0 lb

## 2017-03-14 DIAGNOSIS — Z01419 Encounter for gynecological examination (general) (routine) without abnormal findings: Secondary | ICD-10-CM

## 2017-03-14 DIAGNOSIS — Z1151 Encounter for screening for human papillomavirus (HPV): Secondary | ICD-10-CM | POA: Diagnosis not present

## 2017-03-14 DIAGNOSIS — Z9189 Other specified personal risk factors, not elsewhere classified: Secondary | ICD-10-CM | POA: Diagnosis not present

## 2017-03-14 NOTE — Progress Notes (Signed)
Mercedes Jarvis 23-Mar-1961 [id number]   History:    56 y.o. G2P2 Married.  Moving back home to [place]. 1 son in Medical school 2nd yr in [place].  1 son in [place] for 2 more years.    RP:  Established patient presenting for Annual/Gyn exam  Menses reg qmonth normal.  Mild PMS.  No vaginal d/c.  No pelvic pain.  Breasts wnl.  Mictions/BMs normal.  Fit/eating well.  BMI 21.47.  Past medical history,surgical history, family history and social history were all reviewed and documented in the EPIC chart.  Gynecologic History Patient's last menstrual period was 03/04/2017. Contraception: vasectomy Last Pap: 2016. Results were: normal Last mammogram: 02/2017. Results were: Pending result  Obstetric History OB History  Gravida Para Term Preterm AB Living  2 2       2   SAB TAB Ectopic Multiple Live Births               # Outcome Date GA Lbr Len/2nd Weight Sex Delivery Anes PTL Lv  2 Para           1 Para                ROS: A ROS was performed and pertinent positives and negatives are included in the history.  GENERAL: No fevers or chills. HEENT: No change in vision, no earache, sore throat or sinus congestion. NECK: No pain or stiffness. CARDIOVASCULAR: No chest pain or pressure. No palpitations. PULMONARY: No shortness of breath, cough or wheeze. GASTROINTESTINAL: No abdominal pain, nausea, vomiting or diarrhea, melena or bright red blood per rectum. GENITOURINARY: No urinary frequency, urgency, hesitancy or dysuria. MUSCULOSKELETAL: No joint or muscle pain, no back pain, no recent trauma. DERMATOLOGIC: No rash, no itching, no lesions. ENDOCRINE: No polyuria, polydipsia, no heat or cold intolerance. No recent change in weight. HEMATOLOGICAL: No anemia or easy bruising or bleeding. NEUROLOGIC: No headache, seizures, numbness, tingling or weakness. PSYCHIATRIC: No depression, no loss of interest in normal activity or change in sleep pattern.     Exam:   BP 130/72   Ht 5\' 6"  (1.676 m)    Wt 133 lb (60.3 kg)   LMP 03/04/2017   BMI 21.47 kg/m   Body mass index is 21.47 kg/m.  General appearance : Well developed well nourished female. No acute distress HEENT: Eyes: no retinal hemorrhage or exudates,  Neck supple, trachea midline, no carotid bruits, no thyroidmegaly Lungs: Clear to auscultation, no rhonchi or wheezes, or rib retractions  Heart: Regular rate and rhythm, no murmurs or gallops Breast:Examined in sitting and supine position were symmetrical in appearance, no palpable masses or tenderness,  no skin retraction, no nipple inversion, no nipple discharge, no skin discoloration, no axillary or supraclavicular lymphadenopathy Abdomen: no palpable masses or tenderness, no rebound or guarding Extremities: no edema or skin discoloration or tenderness  Pelvic:  Bartholin, Urethra, Skene Glands: Within normal limits             Vagina: No gross lesions or discharge  Cervix: No gross lesions or discharge.  Pap/HPV HR done.  Uterus  AV, normal size, shape and consistency, non-tender and mobile  Adnexa  Without masses or tenderness  Anus and perineum  normal    Assessment/Plan:  56 y.o. female for annual exam  1. Encounter for routine gynecological examination with Papanicolaou smear of cervix Normal Gyn exam.  Pap/HPV HR done.  Will obtain Mammo result.  F/U fasting labs.  2. Relies on partner vasectomy for  contraception   Genia DelMarie-Lyne Adreana Coull MD, 9:43 AM 03/14/2017

## 2017-03-14 NOTE — Patient Instructions (Signed)
1. Encounter for routine gynecological examination with Papanicolaou smear of cervix Normal Gyn exam.  Pap/HPV HR done.  Will obtain Mammo result.  F/U fasting labs.  2. Relies on partner vasectomy for contraception   Mercedes Jarvis, it was a pleasure to see you today!  I will inform you of your results as soon as available.  The best with your move back home!  I will be there for you if you need me.

## 2017-03-14 NOTE — Addendum Note (Signed)
Addended by: Berna SpareASTILLO, BLANCA A on: 03/14/2017 10:27 AM   Modules accepted: Orders

## 2017-03-15 ENCOUNTER — Other Ambulatory Visit: Payer: Managed Care, Other (non HMO)

## 2017-03-15 LAB — COMPREHENSIVE METABOLIC PANEL
ALT: 14 U/L (ref 6–29)
AST: 20 U/L (ref 10–35)
Albumin: 4.1 g/dL (ref 3.6–5.1)
Alkaline Phosphatase: 39 U/L (ref 33–130)
BILIRUBIN TOTAL: 0.4 mg/dL (ref 0.2–1.2)
BUN: 11 mg/dL (ref 7–25)
CO2: 25 mmol/L (ref 20–31)
CREATININE: 0.75 mg/dL (ref 0.50–1.05)
Calcium: 9 mg/dL (ref 8.6–10.4)
Chloride: 104 mmol/L (ref 98–110)
Glucose, Bld: 76 mg/dL (ref 65–99)
Potassium: 4.1 mmol/L (ref 3.5–5.3)
SODIUM: 138 mmol/L (ref 135–146)
Total Protein: 6.2 g/dL (ref 6.1–8.1)

## 2017-03-15 LAB — CBC
HCT: 37.2 % (ref 35.0–45.0)
Hemoglobin: 11.6 g/dL — ABNORMAL LOW (ref 11.7–15.5)
MCH: 28 pg (ref 27.0–33.0)
MCHC: 31.2 g/dL — AB (ref 32.0–36.0)
MCV: 89.6 fL (ref 80.0–100.0)
MPV: 9.4 fL (ref 7.5–12.5)
Platelets: 246 10*3/uL (ref 140–400)
RBC: 4.15 MIL/uL (ref 3.80–5.10)
RDW: 15 % (ref 11.0–15.0)
WBC: 3.7 10*3/uL — ABNORMAL LOW (ref 3.8–10.8)

## 2017-03-15 LAB — TSH: TSH: 1.63 mIU/L

## 2017-03-15 LAB — LIPID PANEL
Cholesterol: 202 mg/dL — ABNORMAL HIGH (ref <200)
HDL: 81 mg/dL (ref >50)
LDL Cholesterol: 111 mg/dL — ABNORMAL HIGH (ref <100)
Total CHOL/HDL Ratio: 2.5 Ratio (ref <5.0)
Triglycerides: 51 mg/dL (ref <150)
VLDL: 10 mg/dL (ref <30)

## 2017-03-15 LAB — PAP, TP IMAGING W/ HPV RNA, RFLX HPV TYPE 16,18/45: HPV MRNA, HIGH RISK: NOT DETECTED

## 2017-03-16 LAB — HEMOGLOBIN A1C
Hgb A1c MFr Bld: 5.4 % (ref <5.7)
Mean Plasma Glucose: 108 mg/dL

## 2017-03-19 LAB — VITAMIN D 1,25 DIHYDROXY
VITAMIN D 1, 25 (OH) TOTAL: 48 pg/mL (ref 18–72)
Vitamin D2 1, 25 (OH)2: <8 pg/mL
Vitamin D3 1, 25 (OH)2: 48 pg/mL
